# Patient Record
Sex: Male | Born: 1965 | Race: White | Hispanic: No | Marital: Single | Smoking: Current every day smoker
Health system: Southern US, Community
[De-identification: ages and names within clinical notes are randomized; demographics above are authoritative.]

## PROBLEM LIST (undated history)

## (undated) DIAGNOSIS — J45909 Unspecified asthma, uncomplicated: Secondary | ICD-10-CM

## (undated) DIAGNOSIS — J449 Chronic obstructive pulmonary disease, unspecified: Secondary | ICD-10-CM

## (undated) DIAGNOSIS — Z720 Tobacco use: Secondary | ICD-10-CM

## (undated) HISTORY — DX: Tobacco use: Z72.0

## (undated) HISTORY — PX: OTHER SURGICAL HISTORY: SHX169

---

## 2010-03-07 LAB — PULMONARY FUNCTION TEST

## 2018-10-28 ENCOUNTER — Encounter: Payer: Self-pay | Admitting: Emergency Medicine

## 2018-10-28 ENCOUNTER — Other Ambulatory Visit: Payer: Self-pay

## 2018-10-28 ENCOUNTER — Ambulatory Visit
Admission: EM | Admit: 2018-10-28 | Discharge: 2018-10-28 | Disposition: A | Discharge status: Home / Self Care | Payer: Worker's Compensation

## 2018-10-28 ENCOUNTER — Ambulatory Visit (INDEPENDENT_AMBULATORY_CARE_PROVIDER_SITE_OTHER): Payer: Worker's Compensation

## 2018-10-28 DIAGNOSIS — M25532 Pain in left wrist: Secondary | ICD-10-CM

## 2018-10-28 DIAGNOSIS — S6992XA Unspecified injury of left wrist, hand and finger(s), initial encounter: Secondary | ICD-10-CM | POA: Diagnosis not present

## 2018-10-28 HISTORY — DX: Chronic obstructive pulmonary disease, unspecified: J44.9

## 2018-10-28 MED ORDER — HYDROCODONE-ACETAMINOPHEN 5-325 MG PO TABS: 1.0000 | ORAL_TABLET | Freq: Two times a day (BID) | ORAL | 0 refills | Status: AC | PRN

## 2018-10-28 NOTE — ED Triage Notes (Signed)
Patient presents to [facility] for assessment of left hand and wrist pain after he got his hand caught in a turning wheel at work.

## 2018-10-28 NOTE — Discharge Instructions (Addendum)
Ice, rest, elevate the area(s) of pain.   Return if you develop worsening pain, swelling, decreasing to motion.

## 2018-10-28 NOTE — ED Notes (Signed)
Patient able to ambulate independently  

## 2018-10-28 NOTE — ED Provider Notes (Signed)
[name]    CSN: [id number] Arrival date & time: 10/28/18  1814      History   Chief Complaint Chief Complaint  Patient presents with  . Arm Pain    HPI Geoffrey Blankenship is a 53 y.o. male with history of COPD presenting for left hand and wrist pains after getting his hand caught in a turning wheel at work today.  Patient dorsum pain, swelling.  Tried taking 800 mg ibuprofen, applying ice without significant relief.  Of note, patient has previously fractured his right scaphoid x2.   Past Medical History:  Diagnosis Date  . COPD (chronic obstructive pulmonary disease) (HCC)     There are no active problems to display for this patient.   Past Surgical History:  Procedure Laterality Date  . ganglion cyst removal         Home Medications    Prior to Admission medications   Medication Sig Start Date End Date Taking? Authorizing Provider  albuterol (PROVENTIL) (2.5 MG/3ML) 0.083% nebulizer solution Take 2.5 mg by nebulization every 6 (six) hours as needed for wheezing or shortness of breath.   Yes [provider]  HYDROcodone-acetaminophen (NORCO/VICODIN) 5-325 MG tablet Take 1 tablet by mouth 2 (two) times daily as needed for up to 3 days. 10/28/18 10/31/18  Hall-Potvin, [place], PA-C    Family History History reviewed. No pertinent family history.  Social History Social History   Tobacco Use  . Smoking status: Current Every Day Smoker    Packs/day: 0.50  . Smokeless tobacco: Never Used  Substance Use Topics  . Alcohol use: Not Currently    Comment: very light  . Drug use: Never     Allergies   Morphine and related   Review of Systems Review of Systems  Constitutional: Negative for fatigue and fever.  Respiratory: Negative for cough and shortness of breath.   Cardiovascular: Negative for chest pain and palpitations.  Musculoskeletal:       Positive for left hand and wrist pain, decreased range of motion, weakness  Neurological:  Negative for weakness and numbness.     Physical Exam Triage Vital Signs ED Triage Vitals  Enc Vitals Group     BP 10/28/18 1830 123/86     Pulse Rate 10/28/18 1830 91     Resp 10/28/18 1830 20     Temp 10/28/18 1830 98.5 F (36.9 C)     Temp Source 10/28/18 1830 Oral     SpO2 10/28/18 1830 95 %     Weight --      Height --      Head Circumference --      Peak Flow --      Pain Score 10/28/18 1831 6     Pain Loc --      Pain Edu? --      Excl. in [place]? --    No data found.  Updated Vital Signs BP 123/86 (BP Location: Right Arm)   Pulse 91   Temp 98.5 F (36.9 C) (Oral)   Resp 20   SpO2 95%    Physical Exam Constitutional:      General: He is not in acute distress. HENT:     Head: Normocephalic and atraumatic.  Eyes:     General: No scleral icterus.    Pupils: Pupils are equal, round, and reactive to light.  Cardiovascular:     Rate and Rhythm: Normal rate.  Pulmonary:     Effort: Pulmonary effort is normal. No respiratory  distress.     Breath sounds: No wheezing.  Musculoskeletal:     Comments: No obvious bony deformity of left wrist/hand.  No distal radial or ulnar head tenderness.  TTP to snuffbox, carpals, MCP of second and third digit.  Decreased active ROM of left wrist, finger second to pain.  NVI  Skin:    General: Skin is warm.     Capillary Refill: Capillary refill takes less than 2 seconds.     Coloration: Skin is not jaundiced.     Findings: No bruising.  Neurological:     General: No focal deficit present.     Mental Status: He is alert.     Sensory: No sensory deficit.     Deep Tendon Reflexes: Reflexes normal.      UC Treatments / Results  Labs (all labs ordered are listed, but only abnormal results are displayed) Labs Reviewed - No data to display  EKG   Radiology Dg Wrist Complete Left  Addendum Date: 10/28/2018   ADDENDUM REPORT: 10/28/2018 19:47 ADDENDUM: By report, the patient is focally tender over the scaphoid. There is a  linear sclerotic area which most likely represents overlapping of bone in this area. Conceivably, an impaction type injury in this area could present in this manner. Given the clinical history and this finding, it is reasonable to consider immobilization with reimaging in approximately 7 days to further evaluate. If symptoms persist, MR would be the optimum study to assess for possible marrow edema in this area. These results were discussed by telephone at the time of interpretation on 10/28/2018 at 7:47 pm with provider BRITTANY HALL-POTVIN , who verbally acknowledged these results. Electronically Signed   By: Bretta BangWilliam  Woodruff III M.D.   On: 10/28/2018 19:47   Result Date: 10/28/2018 CLINICAL DATA:  Caught wrist and turning wheel EXAM: LEFT WRIST - COMPLETE 3+ VIEW COMPARISON:  None. FINDINGS: Frontal, oblique, lateral, and ulnar deviation scaphoid images were obtained. No fracture or dislocation. Joint spaces appear normal. No erosive change. IMPRESSION: No fracture or dislocation.  No evident arthropathy. Electronically Signed: By: Bretta BangWilliam  Woodruff III M.D. On: 10/28/2018 19:03    Procedures Procedures (including critical care time)  Medications Ordered in UC Medications - No data to display  Initial Impression / Assessment and Plan / UC Course  I have reviewed the triage vital signs and the nursing notes.  Pertinent labs & imaging results that were available during my care of the patient were reviewed by me and considered in my medical decision making (see chart for details).    1.  Acute left wrist pain Left wrist x-ray obtained in office, reviewed by me and radiology: Negative for fracture, though given focal tenderness over scaphoid and mechanism of injury, reimaging in 7 days after immobilization could be considered.  Reviewed findings with patient who verbalized understanding.  Provided short-term pain management to help him sleep through the night as well as thumb spica splint which he  tolerated well.  Patient had adverse reaction to morphine, though states he has tolerated hydrocodone well.  Hand orthopedist information given.  Return precautions discussed, patient verbalized understanding and is agreeable to plan. Final Clinical Impressions(s) / UC Diagnoses   Final diagnoses:  Acute pain of left wrist     Discharge Instructions     Ice, rest, elevate the area(s) of pain.   Return if you develop worsening pain, swelling, decreasing to motion.    ED Prescriptions    Medication Sig Dispense Auth. Provider  HYDROcodone-acetaminophen (NORCO/VICODIN) 5-325 MG tablet Take 1 tablet by mouth 2 (two) times daily as needed for up to 3 days. 6 tablet Hall-Potvin, [place], PA-C     I have reviewed the PDMP during this encounter.   Hall-Potvin, [place], [place] 10/28/18 2024

## 2019-07-11 ENCOUNTER — Ambulatory Visit: Payer: Managed Care, Other (non HMO) | Admitting: Internal Medicine

## 2019-07-11 ENCOUNTER — Encounter: Payer: Self-pay | Admitting: Internal Medicine

## 2019-07-11 ENCOUNTER — Other Ambulatory Visit: Payer: Self-pay

## 2019-07-11 VITALS — BP 110/70 | HR 87 | Temp 97.9°F

## 2019-07-11 DIAGNOSIS — J439 Emphysema, unspecified: Secondary | ICD-10-CM

## 2019-07-11 DIAGNOSIS — R208 Other disturbances of skin sensation: Secondary | ICD-10-CM

## 2019-07-11 DIAGNOSIS — Z720 Tobacco use: Secondary | ICD-10-CM | POA: Diagnosis not present

## 2019-07-11 DIAGNOSIS — H9193 Unspecified hearing loss, bilateral: Secondary | ICD-10-CM

## 2019-07-11 DIAGNOSIS — R200 Anesthesia of skin: Secondary | ICD-10-CM

## 2019-07-11 MED ORDER — BUDESONIDE-FORMOTEROL FUMARATE 160-4.5 MCG/ACT IN AERO: 2.0000 | INHALATION_SPRAY | Freq: Two times a day (BID) | RESPIRATORY_TRACT | 3 refills | Status: AC

## 2019-07-11 MED ORDER — SPIRIVA RESPIMAT 2.5 MCG/ACT IN AERS: 2.0000 | INHALATION_SPRAY | Freq: Every day | RESPIRATORY_TRACT | 2 refills | Status: AC

## 2019-07-11 MED ORDER — ALBUTEROL SULFATE HFA 108 (90 BASE) MCG/ACT IN AERS: 2.0000 | INHALATION_SPRAY | Freq: Four times a day (QID) | RESPIRATORY_TRACT | 2 refills | Status: AC | PRN

## 2019-07-11 MED ORDER — BUPROPION HCL ER (XL) 150 MG PO TB24: 150.0000 mg | ORAL_TABLET | Freq: Every day | ORAL | 1 refills | Status: DC

## 2019-07-11 NOTE — Patient Instructions (Addendum)
-  Nice seeing you today!!  -Start Symbicort 2 puffs twice daily.  -Start Spiriva 2 puffs daily.  -Albuterol as needed for shortness of breath.  -Referrals to audiology and sports medicine placed today.  -Start wellbutrin 150 mg daily as a smoking cessation aid.  -Please consider arranging counseling sessions to assist with smoking cessation.  -Schedule follow up in 3 months for your physical. Come in fasting that day. May schedule sooner if you would like to touch base about your smoking.

## 2019-07-11 NOTE — Progress Notes (Signed)
New Patient Office Visit     This visit occurred during the SARS-CoV-2 public health emergency.  Safety protocols were in place, including screening questions prior to the visit, additional usage of staff PPE, and extensive cleaning of exam room while observing appropriate contact time as indicated for disinfecting solutions.    CC/Reason for Visit: Establish care, discuss chronic conditions and some acute concerns Previous PCP: In [place] Last Visit: 1 to 2 years ago  HPI: Geoffrey Blankenship is a 54 y.o. male who is coming in today for the above mentioned reasons. Past Medical History is significant for: COPD and tobacco abuse.  He moved from [place] last year, he was in the [employer] and served 4 years.  He is separated, he has been a smoker for over 40 years of anywhere from 1 to 3 packs a day, he has had multiple surgeries including an appendectomy in 1987, cholecystectomy in 2011, left elbow repair in 2006 and a right ankle fracture in 2008.  His family history is significant for mother with alcohol abuse, asthma, COPD, depression, diabetes, hyperlipidemia and hypertension, father with alcohol abuse, asthma, COPD, depression, drug abuse, high cholesterol, hypertension.  His siblings have also suffered from drug addiction.  He would like to discuss some acute issues:  1.  He has been on albuterol and Flovent for his COPD and is requesting refills.  2.  He has significantly decreased hearing and is requesting an audiology referral.  3.  He is interested in smoking cessation.  4.  He had a ganglion cyst removed on the sole of his left foot in 2018 and ever since then has been experiencing some numbness of the plantar area, he had some custom orthotics at some point that worked well for him but once those "wore down" numbness returned.   Past Medical/Surgical History: Past Medical History:  Diagnosis Date  . COPD (chronic obstructive pulmonary disease) (Max)   . Tobacco abuse       Past Surgical History:  Procedure Laterality Date  . ganglion cyst removal      Social History:  reports that he has been smoking. He has been smoking about 0.50 packs per day. He has never used smokeless tobacco. He reports previous alcohol use. He reports that he does not use drugs.  Allergies: Allergies  Allergen Reactions  . Morphine And Related Other (See Comments)    "stops my heart"    Family History:  Family History  Problem Relation Age of Onset  . Hypertension Mother   . Hyperlipidemia Mother   . COPD Mother   . Alcohol abuse Mother   . Diabetes Mother   . Alcohol abuse Father   . COPD Father   . Diabetes Father   . Hypertension Father   . Hyperlipidemia Father      Current Outpatient Medications:  .  albuterol (VENTOLIN HFA) 108 (90 Base) MCG/ACT inhaler, Inhale 2 puffs into the lungs every 6 (six) hours as needed for wheezing or shortness of breath., Disp: 6.7 g, Rfl: 2 .  budesonide-formoterol (SYMBICORT) 160-4.5 MCG/ACT inhaler, Inhale 2 puffs into the lungs 2 (two) times daily., Disp: 1 Inhaler, Rfl: 3 .  buPROPion (WELLBUTRIN XL) 150 MG 24 hr tablet, Take 1 tablet (150 mg total) by mouth daily., Disp: 90 tablet, Rfl: 1 .  Tiotropium Bromide Monohydrate (SPIRIVA RESPIMAT) 2.5 MCG/ACT AERS, Inhale 2 puffs into the lungs daily., Disp: 4 g, Rfl: 2  Review of Systems:  Constitutional: Denies fever, chills,  diaphoresis, appetite change and fatigue.  HEENT: Denies photophobia, eye pain, redness, hearing loss, ear pain, congestion, sore throat, rhinorrhea, sneezing, mouth sores, trouble swallowing, neck pain, neck stiffness and tinnitus.   Respiratory: Denies SOB, DOE, cough, chest tightness,  and wheezing.   Cardiovascular: Denies chest pain, palpitations and leg swelling.  Gastrointestinal: Denies nausea, vomiting, abdominal pain, diarrhea, constipation, blood in stool and abdominal distention.  Genitourinary: Denies dysuria, urgency, frequency, hematuria,  flank pain and difficulty urinating.  Endocrine: Denies: hot or cold intolerance, sweats, changes in hair or nails, polyuria, polydipsia. Musculoskeletal: Denies myalgias, back pain, joint swelling, arthralgias and gait problem.  Skin: Denies pallor, rash and wound.  Neurological: Denies dizziness, seizures, syncope, weakness, light-headedness, numbness and headaches.  Hematological: Denies adenopathy. Easy bruising, personal or family bleeding history  Psychiatric/Behavioral: Denies suicidal ideation, mood changes, confusion, nervousness, sleep disturbance and agitation    Physical Exam: Vitals:   07/11/19 1445  BP: 110/70  Pulse: 87  Temp: 97.9 F (36.6 C)  TempSrc: Temporal  SpO2: 97%   There is no height or weight on file to calculate BMI.  Constitutional: NAD, calm, comfortable Eyes: PERRL, lids and conjunctivae normal ENMT: Mucous membranes are moist. Tympanic membrane is pearly white, no erythema or bulging. Neck: normal, supple, no masses, no thyromegaly Respiratory: clear to auscultation bilaterally, no wheezing, no crackles. Normal respiratory effort. No accessory muscle use.  Cardiovascular: Regular rate and rhythm, no murmurs / rubs / gallops. No extremity edema. 2+ pedal pulses.  Neurologic: Grossly intact and nonfocal Psychiatric: Normal judgment and insight. Alert and oriented x 3. Normal mood.    Impression and Plan:  Pulmonary emphysema, unspecified emphysema type (HCC)  -Optimize his regimen with Spiriva, Symbicort and albuterol as needed. -Consider pulmonary referral.  Tobacco abuse  -I have discussed tobacco cessation with the patient.  I have counseled the patient regarding the negative impacts of continued tobacco use including but not limited to lung cancer, COPD, and cardiovascular disease.  I have discussed alternatives to tobacco and modalities that may help facilitate tobacco cessation including but not limited to biofeedback, hypnosis, and  medications.  Total time spent with tobacco counseling was 9 minutes. -He is 100% willing to quit smoking. -Start Wellbutrin, have encouraged him to sign up for CBT sessions.    Bilateral hearing loss, unspecified hearing loss type  - Plan: Ambulatory referral to Audiology  Numbness of left foot  - Plan: Ambulatory referral to Sports Medicine    Patient Instructions  -Nice seeing you today!!  -Start Symbicort 2 puffs twice daily.  -Start Spiriva 2 puffs daily.  -Albuterol as needed for shortness of breath.  -Referrals to audiology and sports medicine placed today.  -Start wellbutrin 150 mg daily as a smoking cessation aid.  -Please consider arranging counseling sessions to assist with smoking cessation.  -Schedule follow up in 3 months for your physical. Come in fasting that day. May schedule sooner if you would like to touch base about your smoking.     Chaya Jan, MD [facility]

## 2019-07-16 ENCOUNTER — Encounter: Payer: Self-pay | Admitting: Internal Medicine

## 2019-07-17 NOTE — Telephone Encounter (Signed)
Prior auths were submitted for the following meds: -Spiriva respimat 2.73mcg/ct-key [id number] -Budesonide-formoterol fumarate 160-4.3mcg/ct Kristine Royal  Message sent to the pt with this information and pending further info from the insurance.

## 2019-07-21 ENCOUNTER — Encounter: Payer: Self-pay | Admitting: Family Medicine

## 2019-07-21 ENCOUNTER — Ambulatory Visit (INDEPENDENT_AMBULATORY_CARE_PROVIDER_SITE_OTHER): Payer: Managed Care, Other (non HMO) | Admitting: Family Medicine

## 2019-07-21 ENCOUNTER — Other Ambulatory Visit: Payer: Self-pay

## 2019-07-21 ENCOUNTER — Ambulatory Visit (INDEPENDENT_AMBULATORY_CARE_PROVIDER_SITE_OTHER): Payer: Managed Care, Other (non HMO)

## 2019-07-21 VITALS — BP 110/78 | HR 79 | Ht 71.5 in | Wt 182.0 lb

## 2019-07-21 DIAGNOSIS — M79672 Pain in left foot: Secondary | ICD-10-CM

## 2019-07-21 NOTE — Patient Instructions (Signed)
Thank you for coming in today. I think your issue in the forefoot is metatarsalgia.  YOu also need good arch support.  Ok to use the hapad metatarsal pads and scaphoid pads.  OK to also buy metatarsalgia insoles with good arch support.  If not enough let me know and I will refer to podiatry for orthotics.   You could also find similar insoles are sporting good store or a running store like [employer].

## 2019-07-21 NOTE — Progress Notes (Signed)
Subjective:    I'm seeing this patient as a consultation for:  Philip Aspen, Limmie Patricia, MD . Note will be routed back to referring provider/PCP.  CC: Left foot pain   I, Debbe Odea, am serving as a scribe for Dr. Clementeen Graham.  HPI: patient is a 54 year old male presenting to [facility] for L foot pain. Patient states that he can feel the pressure on his left lateral foot and is tingling at times. If he gets over 4000 steps a day his foot will hurt him really bad. Describes pain as a throbbing pain. Locates pain to lateral left foot and arch of foot. Did get new shoes yesterday.   Surgery on left ball of foot in [place] had a ganglion cyst removed. March 2019 was told the cyst was growing over ten years. Patient had special orthotic inserts mad had stopped using them 3 months ago. Dr. Julian Reil out of [place]. Out of [facility].  Past medical history, Surgical history, Family history, Social history, Allergies, and medications have been entered into the medical record, reviewed.   Review of Systems: No new headache, visual changes, nausea, vomiting, diarrhea, constipation, dizziness, abdominal pain, skin rash, fevers, chills, night sweats, weight loss, swollen lymph nodes, body aches, joint swelling, muscle aches, chest pain, shortness of breath, mood changes, visual or auditory hallucinations.   Objective:    Vitals:   07/21/19 1545  BP: 110/78  Pulse: 79  SpO2: 98%   General: Well Developed, well nourished, and in no acute distress.  Neuro/Psych: Alert and oriented x3, extra-ocular muscles intact, able to move all 4 extremities, sensation grossly intact. Skin: Warm and dry, no rashes noted.  Respiratory: Not using accessory muscles, speaking in full sentences, trachea midline.  Cardiovascular: Pulses palpable, no extremity edema. Abdomen: Does not appear distended. MSK: Left foot Mature scar medial hindfoot. Small callus  formation metatarsal heads 1-3 4 and 5. Normal foot and ankle motion. Tender palpation plantar fourth metatarsal head otherwise nontender. Strength intact. Pulses capillary fill and sensation are intact distally.  Lab and Radiology Results Diagnostic Limited MSK Ultrasound of: Left plantar foot Normal-appearing plantar fascia. Normal-appearing bony structures. Normal-appearing fourth metatarsal head without effusion. Impression: Largely normal-appearing plantar foot   Impression and Recommendations:    Assessment and Plan: 54 y.o. male with left plantar foot pain.  Majority of pain at forefoot is fourth metatarsalgia.  Also has some mid arch pain over structures normal-appearing in this area.  This is the area he had surgery previously. Plan to proceed with metatarsal pads and scaphoid pads.  Also recommend over-the-counter insole that we will do the same.  If not improving will refer to podiatry for custom orthotics.Marland Kitchen  PDMP not reviewed this encounter. Orders Placed This Encounter  Procedures  . [place] LIMITED JOINT SPACE STRUCTURES LOW LEFT    Standing Status:   Future    Number of Occurrences:   1    Standing Expiration Date:   07/20/2020    Order Specific Question:   Reason for Exam (SYMPTOM  OR DIAGNOSIS REQUIRED)    Answer:   Left foot pain    Order Specific Question:   Preferred imaging location?    Answer:   [facility]   No orders of the defined types were placed in this encounter.   Discussed warning signs or symptoms. Please see discharge instructions. Patient expresses understanding.   The above documentation has been reviewed and  is accurate and complete Lynne Leader, M.D.

## 2019-07-29 NOTE — Telephone Encounter (Signed)
Questions were answered and waiting on a determination.

## 2019-07-31 ENCOUNTER — Other Ambulatory Visit: Payer: Self-pay

## 2019-07-31 ENCOUNTER — Ambulatory Visit: Payer: Managed Care, Other (non HMO) | Attending: Internal Medicine | Admitting: Audiologist

## 2019-07-31 DIAGNOSIS — H903 Sensorineural hearing loss, bilateral: Secondary | ICD-10-CM | POA: Diagnosis present

## 2019-07-31 DIAGNOSIS — H9313 Tinnitus, bilateral: Secondary | ICD-10-CM | POA: Diagnosis present

## 2019-07-31 NOTE — Procedures (Signed)
  Outpatient Audiology and [facility] [address] [place], [place]  [address] [phone]  AUDIOLOGICAL  EVALUATION  NAME: Geoffrey Blankenship     DOB:   1965/06/16      MRN: [id number]                                                                                     DATE: 07/31/2019     REFERENT: Philip Aspen, Limmie Patricia, MD STATUS: Outpatient DIAGNOSIS: Sensorineural Hearing Loss Bilateral, Tinnitus Bilaterally     History: Geoffrey Blankenship is receiving a hearing evaluation due to concerns for tinnitus and hearing loss. Geoffrey Blankenship has difficulty hearing in background noise, crowds, and when people are at a distance. This difficulty began gradually. No pain or pressure reported in either ear.  Tinnitus present in both ears. Geoffrey Blankenship has a history of noise exposure from listening to headphones at very high volumes and using firearms without hearing protection throughout his life.  No other relevant case history reported.   Evaluation:   Otoscopy showed a clear view of the tympanic membranes, bilaterally  Tympanometry results were consistent with normal function of the middle ear, bilaterally   Audiometric testing was completed using conventional audiometry with headphone transducer. Speech Recognition Thresholds were consistent with pure tone averages. Word Recognition was excellent at  an elevated level and fair at conversation level. Pure tone thresholds show normal sloping to moderately severe sensorineural hearing loss in both ears. Test results are consistent with reports hearing difficulty.   Tinnitus pitch and loudness matched to 8k Hz at 68dB (about 3dB SL) at. This is a softer than average tinnitus showing good candidacy for tinnitus retraining therapy.  Results:  The test results were reviewed with Geoffrey Blankenship. He was given two copies of the results. The hearing loss severity was explained. Geoffrey Blankenship was counseled on the pitch of his tinnitus and what is an appropriate next step. Due to the  significant improvement in speech recognition at an elevated volume, and low sensation level of his tinnitus Geoffrey Blankenship is an excellent candidate for hearing aids with a tinnitus masking or notch therapy protocol. He was given a list of local audiologists who dispense hearing aids to pursue amplification    Recommendations: 1.   Amplification is necessary for both ears. Hearing aids can be purchased from a variety of locations. See provided list for locations in the [place] area. An audiologist familiar with programming notch and or masking therapy for tinnitus is necessary due to Geoffrey Blankenship's significant tinnitus in both ears.   Geoffrey Blankenship  Audiologist, Au.D., CCC-A  07/31/2019  3:54 PM  Cc: Philip Aspen, Limmie Patricia, MD

## 2019-08-01 ENCOUNTER — Encounter: Payer: Self-pay | Admitting: Internal Medicine

## 2019-08-06 NOTE — Telephone Encounter (Signed)
Prior Auth denied - patient should try Ellipta first

## 2019-08-15 ENCOUNTER — Telehealth: Payer: Self-pay | Admitting: *Deleted

## 2019-08-15 NOTE — Telephone Encounter (Signed)
Spoke with patient and Spiriva is covered with a savings card.  New prior auth was started for Symbicort   (Key: [id number]).  This does not require a Proir aut but is expensive.  Patient will call his insurance company and call us back.    FYI - patient has his last cigarette 08/09/19.

## 2019-10-24 ENCOUNTER — Ambulatory Visit (INDEPENDENT_AMBULATORY_CARE_PROVIDER_SITE_OTHER): Payer: Managed Care, Other (non HMO) | Admitting: Internal Medicine

## 2019-10-24 ENCOUNTER — Other Ambulatory Visit: Payer: Self-pay

## 2019-10-24 ENCOUNTER — Encounter: Payer: Self-pay | Admitting: Internal Medicine

## 2019-10-24 VITALS — BP 102/68 | HR 82 | Temp 98.2°F | Ht 71.0 in | Wt 188.8 lb

## 2019-10-24 DIAGNOSIS — Z23 Encounter for immunization: Secondary | ICD-10-CM

## 2019-10-24 DIAGNOSIS — J439 Emphysema, unspecified: Secondary | ICD-10-CM | POA: Diagnosis not present

## 2019-10-24 DIAGNOSIS — Z720 Tobacco use: Secondary | ICD-10-CM | POA: Diagnosis not present

## 2019-10-24 DIAGNOSIS — Z1211 Encounter for screening for malignant neoplasm of colon: Secondary | ICD-10-CM

## 2019-10-24 DIAGNOSIS — Z0000 Encounter for general adult medical examination without abnormal findings: Secondary | ICD-10-CM | POA: Diagnosis not present

## 2019-10-24 NOTE — Patient Instructions (Signed)
-Nice seeing you today!!  -Lab work today; will notify you once results are available.  -Tetanus and shingles vaccines today.  -Great job on quitting smoking!  -Schedule follow up in 6 months.   Preventive Care 51-55 Years Old, Male Preventive care refers to lifestyle choices and visits with your health care provider that can promote health and wellness. This includes:  A yearly physical exam. This is also called an annual well check.  Regular dental and eye exams.  Immunizations.  Screening for certain conditions.  Healthy lifestyle choices, such as eating a healthy diet, getting regular exercise, not using drugs or products that contain nicotine and tobacco, and limiting alcohol use. What can I expect for my preventive care visit? Physical exam Your health care provider will check:  Height and weight. These may be used to calculate body mass index (BMI), which is a measurement that tells if you are at a healthy weight.  Heart rate and blood pressure.  Your skin for abnormal spots. Counseling Your health care provider may ask you questions about:  Alcohol, tobacco, and drug use.  Emotional well-being.  Home and relationship well-being.  Sexual activity.  Eating habits.  Work and work Astronomer. What immunizations do I need?  Influenza (flu) vaccine  This is recommended every year. Tetanus, diphtheria, and pertussis (Tdap) vaccine  You may need a Td booster every 10 years. Varicella (chickenpox) vaccine  You may need this vaccine if you have not already been vaccinated. Zoster (shingles) vaccine  You may need this after age 45. Measles, mumps, and rubella (MMR) vaccine  You may need at least one dose of MMR if you were born in 1957 or later. You may also need a second dose. Pneumococcal conjugate (PCV13) vaccine  You may need this if you have certain conditions and were not previously vaccinated. Pneumococcal polysaccharide (PPSV23) vaccine  You  may need one or two doses if you smoke cigarettes or if you have certain conditions. Meningococcal conjugate (MenACWY) vaccine  You may need this if you have certain conditions. Hepatitis A vaccine  You may need this if you have certain conditions or if you travel or work in places where you may be exposed to hepatitis A. Hepatitis B vaccine  You may need this if you have certain conditions or if you travel or work in places where you may be exposed to hepatitis B. Haemophilus influenzae type b (Hib) vaccine  You may need this if you have certain risk factors. Human papillomavirus (HPV) vaccine  If recommended by your health care provider, you may need three doses over 6 months. You may receive vaccines as individual doses or as more than one vaccine together in one shot (combination vaccines). Talk with your health care provider about the risks and benefits of combination vaccines. What tests do I need? Blood tests  Lipid and cholesterol levels. These may be checked every 5 years, or more frequently if you are over 77 years old.  Hepatitis C test.  Hepatitis B test. Screening  Lung cancer screening. You may have this screening every year starting at age 39 if you have a 30-pack-year history of smoking and currently smoke or have quit within the past 15 years.  Prostate cancer screening. Recommendations will vary depending on your family history and other risks.  Colorectal cancer screening. All adults should have this screening starting at age 71 and continuing until age 58. Your health care provider may recommend screening at age 97 if you are at  increased risk. You will have tests every 1-10 years, depending on your results and the type of screening test.  Diabetes screening. This is done by checking your blood sugar (glucose) after you have not eaten for a while (fasting). You may have this done every 1-3 years.  Sexually transmitted disease (STD) testing. Follow these  instructions at home: Eating and drinking  Eat a diet that includes fresh fruits and vegetables, whole grains, lean protein, and low-fat dairy products.  Take vitamin and mineral supplements as recommended by your health care provider.  Do not drink alcohol if your health care provider tells you not to drink.  If you drink alcohol: ? Limit how much you have to 0-2 drinks a day. ? Be aware of how much alcohol is in your drink. In the [place] one drink equals one 12 oz bottle of beer (355 mL), one 5 oz glass of wine (148 mL), or one 1 oz glass of hard liquor (44 mL). Lifestyle  Take daily care of your teeth and gums.  Stay active. Exercise for at least 30 minutes on 5 or more days each week.  Do not use any products that contain nicotine or tobacco, such as cigarettes, e-cigarettes, and chewing tobacco. If you need help quitting, ask your health care provider.  If you are sexually active, practice safe sex. Use a condom or other form of protection to prevent STIs (sexually transmitted infections).  Talk with your health care provider about taking a low-dose aspirin every day starting at age 53. What's next?  Go to your health care provider once a year for a well check visit.  Ask your health care provider how often you should have your eyes and teeth checked.  Stay up to date on all vaccines. This information is not intended to replace advice given to you by your health care provider. Make sure you discuss any questions you have with your health care provider. Document Revised: 01/17/2018 Document Reviewed: 01/17/2018 Elsevier Patient Education  2020 [employer].

## 2019-10-24 NOTE — Progress Notes (Signed)
   Established Patient Office Visit     This visit occurred during the SARS-CoV-2 public health emergency.  Safety protocols were in place, including screening questions prior to the visit, additional usage of staff PPE, and extensive cleaning of exam room while observing appropriate contact time as indicated for disinfecting solutions.    CC/Reason for Visit: Annual preventive exam  HPI: Geoffrey Blankenship is a 54 y.o. male who is coming in today for the above mentioned reasons. Past Medical History is significant for: COPD and tobacco abuse. At last visit he was started on Wellbutrin to assist with smoking cessation and he is proud to report that he has fully quit smoking. He has been having a counselor through his insurance who checks in with him once a week. He is due for Covid, flu, tetanus, shingles vaccines. He is overdue for screening colonoscopy. He has no acute complaints today.   Past Medical/Surgical History: Past Medical History:  Diagnosis Date  . COPD (chronic obstructive pulmonary disease) (HCC)   . Tobacco abuse     Past Surgical History:  Procedure Laterality Date  . ganglion cyst removal      Social History:  reports that he has been smoking. He has been smoking about 0.50 packs per day. He has never used smokeless tobacco. He reports previous alcohol use. He reports that he does not use drugs.  Allergies: Allergies  Allergen Reactions  . Morphine And Related Other (See Comments)    "stops my heart"    Family History:  Family History  Problem Relation Age of Onset  . Hypertension Mother   . Hyperlipidemia Mother   . COPD Mother   . Alcohol abuse Mother   . Diabetes Mother   . Alcohol abuse Father   . COPD Father   . Diabetes Father   . Hypertension Father   . Hyperlipidemia Father      Current Outpatient Medications:  .  albuterol (VENTOLIN HFA) 108 (90 Base) MCG/ACT inhaler, Inhale 2 puffs into the lungs every 6 (six) hours as needed for  wheezing or shortness of breath., Disp: 6.7 g, Rfl: 2 .  budesonide-formoterol (SYMBICORT) 160-4.5 MCG/ACT inhaler, Inhale 2 puffs into the lungs 2 (two) times daily., Disp: 1 Inhaler, Rfl: 3 .  buPROPion (WELLBUTRIN XL) 150 MG 24 hr tablet, Take 1 tablet (150 mg total) by mouth daily., Disp: 90 tablet, Rfl: 1 .  Tiotropium Bromide Monohydrate (SPIRIVA RESPIMAT) 2.5 MCG/ACT AERS, Inhale 2 puffs into the lungs daily., Disp: 4 g, Rfl: 2  Review of Systems:  Constitutional: Denies fever, chills, diaphoresis, appetite change and fatigue.  HEENT: Denies photophobia, eye pain, redness, hearing loss, ear pain, congestion, sore throat, rhinorrhea, sneezing, mouth sores, trouble swallowing, neck pain, neck stiffness and tinnitus.   Respiratory: Denies SOB, DOE, cough, chest tightness,  and wheezing.   Cardiovascular: Denies chest pain, palpitations and leg swelling.  Gastrointestinal: Denies nausea, vomiting, abdominal pain, diarrhea, constipation, blood in stool and abdominal distention.  Genitourinary: Denies dysuria, urgency, frequency, hematuria, flank pain and difficulty urinating.  Endocrine: Denies: hot or cold intolerance, sweats, changes in hair or nails, polyuria, polydipsia. Musculoskeletal: Denies myalgias, back pain, joint swelling, arthralgias and gait problem.  Skin: Denies pallor, rash and wound.  Neurological: Denies dizziness, seizures, syncope, weakness, light-headedness, numbness and headaches.  Hematological: Denies adenopathy. Easy bruising, personal or family bleeding history  Psychiatric/Behavioral: Denies suicidal ideation, mood changes, confusion, nervousness, sleep disturbance and agitation    Physical Exam: Vitals:   10/24/19   0829  BP: 102/68  Pulse: 82  Temp: 98.2 F (36.8 C)  TempSrc: Oral  SpO2: 95%  Weight: 188 lb 12.8 oz (85.6 kg)  Height: 5' 11" (1.803 m)    Body mass index is 26.33 kg/m.   Constitutional: NAD, calm, comfortable Eyes: PERRL, lids and  conjunctivae normal, wears corrective lenses. ENMT: Mucous membranes are moist. Posterior pharynx clear of any exudate or lesions. Normal dentition. Tympanic membrane is pearly white, no erythema or bulging. Neck: normal, supple, no masses, no thyromegaly Respiratory: clear to auscultation bilaterally, no wheezing, no crackles. Normal respiratory effort. No accessory muscle use.  Cardiovascular: Regular rate and rhythm, no murmurs / rubs / gallops. No extremity edema. 2+ pedal pulses. No carotid bruits.  Abdomen: no tenderness, no masses palpated. No hepatosplenomegaly. Bowel sounds positive.  Musculoskeletal: no clubbing / cyanosis. No joint deformity upper and lower extremities. Good ROM, no contractures. Normal muscle tone.  Skin: no rashes, lesions, ulcers. No induration Neurologic: CN 2-12 grossly intact. Sensation intact, DTR normal. Strength 5/5 in all 4.  Psychiatric: Normal judgment and insight. Alert and oriented x 3. Normal mood.    Impression and Plan:  Encounter for preventive health examination  -He has routine eye care, have advised routine dental care. -He will receive Tdap and for shingles vaccine today, he declines flu and Covid vaccinations. -Screening labs today. -Healthy lifestyle discussed in detail. -Refer to GI for screening colonoscopy. -PSA for prostate cancer screening.  Screening for malignant neoplasm of colon  - Plan: Ambulatory referral to Gastroenterology  Pulmonary emphysema, unspecified emphysema type (HCC)  -He is on Symbicort and Spiriva. -Compensated.  Tobacco abuse -He has been congratulated on his success at quitting smoking, I will continue Wellbutrin for now.  Need for tetanus booster -Tdap administered today.  Need for shingles vaccine -First shingles vaccine administered today.    Patient Instructions  -Nice seeing you today!!  -Lab work today; will notify you once results are available.  -Tetanus and shingles vaccines  today.  -Great job on quitting smoking!  -Schedule follow up in 6 months.   Preventive Care 40-64 Years Old, Male Preventive care refers to lifestyle choices and visits with your health care provider that can promote health and wellness. This includes:  A yearly physical exam. This is also called an annual well check.  Regular dental and eye exams.  Immunizations.  Screening for certain conditions.  Healthy lifestyle choices, such as eating a healthy diet, getting regular exercise, not using drugs or products that contain nicotine and tobacco, and limiting alcohol use. What can I expect for my preventive care visit? Physical exam Your health care provider will check:  Height and weight. These may be used to calculate body mass index (BMI), which is a measurement that tells if you are at a healthy weight.  Heart rate and blood pressure.  Your skin for abnormal spots. Counseling Your health care provider may ask you questions about:  Alcohol, tobacco, and drug use.  Emotional well-being.  Home and relationship well-being.  Sexual activity.  Eating habits.  Work and work environment. What immunizations do I need?  Influenza (flu) vaccine  This is recommended every year. Tetanus, diphtheria, and pertussis (Tdap) vaccine  You may need a Td booster every 10 years. Varicella (chickenpox) vaccine  You may need this vaccine if you have not already been vaccinated. Zoster (shingles) vaccine  You may need this after age 60. Measles, mumps, and rubella (MMR) vaccine  You may need at least   one dose of MMR if you were born in 1957 or later. You may also need a second dose. Pneumococcal conjugate (PCV13) vaccine  You may need this if you have certain conditions and were not previously vaccinated. Pneumococcal polysaccharide (PPSV23) vaccine  You may need one or two doses if you smoke cigarettes or if you have certain conditions. Meningococcal conjugate (MenACWY)  vaccine  You may need this if you have certain conditions. Hepatitis A vaccine  You may need this if you have certain conditions or if you travel or work in places where you may be exposed to hepatitis A. Hepatitis B vaccine  You may need this if you have certain conditions or if you travel or work in places where you may be exposed to hepatitis B. Haemophilus influenzae type b (Hib) vaccine  You may need this if you have certain risk factors. Human papillomavirus (HPV) vaccine  If recommended by your health care provider, you may need three doses over 6 months. You may receive vaccines as individual doses or as more than one vaccine together in one shot (combination vaccines). Talk with your health care provider about the risks and benefits of combination vaccines. What tests do I need? Blood tests  Lipid and cholesterol levels. These may be checked every 5 years, or more frequently if you are over 50 years old.  Hepatitis C test.  Hepatitis B test. Screening  Lung cancer screening. You may have this screening every year starting at age 55 if you have a 30-pack-year history of smoking and currently smoke or have quit within the past 15 years.  Prostate cancer screening. Recommendations will vary depending on your family history and other risks.  Colorectal cancer screening. All adults should have this screening starting at age 50 and continuing until age 75. Your health care provider may recommend screening at age 45 if you are at increased risk. You will have tests every 1-10 years, depending on your results and the type of screening test.  Diabetes screening. This is done by checking your blood sugar (glucose) after you have not eaten for a while (fasting). You may have this done every 1-3 years.  Sexually transmitted disease (STD) testing. Follow these instructions at home: Eating and drinking  Eat a diet that includes fresh fruits and vegetables, whole grains, lean protein,  and low-fat dairy products.  Take vitamin and mineral supplements as recommended by your health care provider.  Do not drink alcohol if your health care provider tells you not to drink.  If you drink alcohol: ? Limit how much you have to 0-2 drinks a day. ? Be aware of how much alcohol is in your drink. In the [place] one drink equals one 12 oz bottle of beer (355 mL), one 5 oz glass of wine (148 mL), or one 1 oz glass of hard liquor (44 mL). Lifestyle  Take daily care of your teeth and gums.  Stay active. Exercise for at least 30 minutes on 5 or more days each week.  Do not use any products that contain nicotine or tobacco, such as cigarettes, e-cigarettes, and chewing tobacco. If you need help quitting, ask your health care provider.  If you are sexually active, practice safe sex. Use a condom or other form of protection to prevent STIs (sexually transmitted infections).  Talk with your health care provider about taking a low-dose aspirin every day starting at age 50. What's next?  Go to your health care provider once a year for a   well check visit.  Ask your health care provider how often you should have your eyes and teeth checked.  Stay up to date on all vaccines. This information is not intended to replace advice given to you by your health care provider. Make sure you discuss any questions you have with your health care provider. Document Revised: 01/17/2018 Document Reviewed: 01/17/2018 Elsevier Patient Education  2020 [place], [place] [facility]

## 2019-10-25 LAB — CBC WITH DIFFERENTIAL/PLATELET
Absolute Monocytes: 974 cells/uL — ABNORMAL HIGH (ref 200–950)
Basophils Absolute: 52 cells/uL (ref 0–200)
Basophils Relative: 0.3 %
Eosinophils Absolute: 226 cells/uL (ref 15–500)
Eosinophils Relative: 1.3 %
HCT: 51.2 % — ABNORMAL HIGH (ref 38.5–50.0)
Hemoglobin: 17.3 g/dL — ABNORMAL HIGH (ref 13.2–17.1)
Lymphs Abs: 2262 cells/uL (ref 850–3900)
MCH: 30.5 pg (ref 27.0–33.0)
MCHC: 33.8 g/dL (ref 32.0–36.0)
MCV: 90.3 fL (ref 80.0–100.0)
MPV: 11.8 fL (ref 7.5–12.5)
Monocytes Relative: 5.6 %
Neutro Abs: 13885 cells/uL — ABNORMAL HIGH (ref 1500–7800)
Neutrophils Relative %: 79.8 %
Platelets: 202 10*3/uL (ref 140–400)
RBC: 5.67 10*6/uL (ref 4.20–5.80)
RDW: 13.3 % (ref 11.0–15.0)
Total Lymphocyte: 13 %
WBC: 17.4 10*3/uL — ABNORMAL HIGH (ref 3.8–10.8)

## 2019-10-25 LAB — COMPREHENSIVE METABOLIC PANEL
AG Ratio: 1.5 (calc) (ref 1.0–2.5)
ALT: 17 U/L (ref 9–46)
AST: 13 U/L (ref 10–35)
Albumin: 4.3 g/dL (ref 3.6–5.1)
Alkaline phosphatase (APISO): 71 U/L (ref 35–144)
BUN: 12 mg/dL (ref 7–25)
CO2: 24 mmol/L (ref 20–32)
Calcium: 9.4 mg/dL (ref 8.6–10.3)
Chloride: 104 mmol/L (ref 98–110)
Creat: 1 mg/dL (ref 0.70–1.33)
Globulin: 2.8 g/dL (calc) (ref 1.9–3.7)
Glucose, Bld: 94 mg/dL (ref 65–99)
Potassium: 4.6 mmol/L (ref 3.5–5.3)
Sodium: 139 mmol/L (ref 135–146)
Total Bilirubin: 0.9 mg/dL (ref 0.2–1.2)
Total Protein: 7.1 g/dL (ref 6.1–8.1)

## 2019-10-25 LAB — LIPID PANEL
Cholesterol: 157 mg/dL (ref <200)
HDL: 37 mg/dL — ABNORMAL LOW (ref >=40)
LDL Cholesterol (Calc): 99 mg/dL (calc)
Non-HDL Cholesterol (Calc): 120 mg/dL (calc) (ref <130)
Total CHOL/HDL Ratio: 4.2 (calc) (ref <5.0)
Triglycerides: 117 mg/dL (ref <150)

## 2019-10-25 LAB — PSA: PSA: 0.88 ng/mL (ref <=4.0)

## 2019-10-28 ENCOUNTER — Other Ambulatory Visit: Payer: Self-pay | Admitting: Internal Medicine

## 2019-10-28 DIAGNOSIS — R7989 Other specified abnormal findings of blood chemistry: Secondary | ICD-10-CM

## 2019-11-18 ENCOUNTER — Other Ambulatory Visit: Payer: Managed Care, Other (non HMO)

## 2019-11-18 ENCOUNTER — Other Ambulatory Visit: Payer: Self-pay

## 2019-11-18 DIAGNOSIS — R7989 Other specified abnormal findings of blood chemistry: Secondary | ICD-10-CM

## 2019-11-18 LAB — CBC WITH DIFFERENTIAL/PLATELET
Absolute Monocytes: 894 cells/uL (ref 200–950)
Basophils Absolute: 69 cells/uL (ref 0–200)
Basophils Relative: 0.4 %
Eosinophils Absolute: 224 cells/uL (ref 15–500)
Eosinophils Relative: 1.3 %
HCT: 50 % (ref 38.5–50.0)
Hemoglobin: 17.5 g/dL — ABNORMAL HIGH (ref 13.2–17.1)
Lymphs Abs: 1909 cells/uL (ref 850–3900)
MCH: 30.9 pg (ref 27.0–33.0)
MCHC: 35 g/dL (ref 32.0–36.0)
MCV: 88.3 fL (ref 80.0–100.0)
MPV: 11 fL (ref 7.5–12.5)
Monocytes Relative: 5.2 %
Neutro Abs: 14104 cells/uL — ABNORMAL HIGH (ref 1500–7800)
Neutrophils Relative %: 82 %
Platelets: 215 10*3/uL (ref 140–400)
RBC: 5.66 10*6/uL (ref 4.20–5.80)
RDW: 13.3 % (ref 11.0–15.0)
Total Lymphocyte: 11.1 %
WBC: 17.2 10*3/uL — ABNORMAL HIGH (ref 3.8–10.8)

## 2019-11-20 ENCOUNTER — Other Ambulatory Visit: Payer: Managed Care, Other (non HMO)

## 2019-11-20 ENCOUNTER — Encounter (INDEPENDENT_AMBULATORY_CARE_PROVIDER_SITE_OTHER): Payer: Self-pay

## 2019-11-20 DIAGNOSIS — Z20822 Contact with and (suspected) exposure to covid-19: Secondary | ICD-10-CM

## 2019-11-21 LAB — NOVEL CORONAVIRUS, NAA: SARS-CoV-2, NAA: DETECTED — AB

## 2019-11-21 LAB — SARS-COV-2, NAA 2 DAY TAT

## 2019-11-23 ENCOUNTER — Encounter: Payer: Self-pay | Admitting: Internal Medicine

## 2019-11-23 ENCOUNTER — Other Ambulatory Visit: Payer: Self-pay | Admitting: Physician Assistant

## 2019-11-23 DIAGNOSIS — J42 Unspecified chronic bronchitis: Secondary | ICD-10-CM

## 2019-11-23 DIAGNOSIS — E663 Overweight: Secondary | ICD-10-CM

## 2019-11-23 DIAGNOSIS — U071 COVID-19: Secondary | ICD-10-CM

## 2019-11-23 NOTE — Progress Notes (Signed)
I connected by phone with Geoffrey Blankenship on 11/23/2019 at 12:27 PM to discuss the potential use of a new treatment for mild to moderate COVID-19 viral infection in non-hospitalized patients.  This patient is a 54 y.o. male that meets the FDA criteria for Emergency Use Authorization of COVID monoclonal antibody casirivimab/imdevimab or bamlanivimab/eteseviamb.  Has a (+) direct SARS-CoV-2 viral test result  Has mild or moderate COVID-19   Is NOT hospitalized due to COVID-19  Is within 10 days of symptom onset  Has at least one of the high risk factor(s) for progression to severe COVID-19 and/or hospitalization as defined in EUA.  Specific high risk criteria : BMI > 25 and Chronic Lung Disease   I have spoken and communicated the following to the patient or parent/caregiver regarding COVID monoclonal antibody treatment:  1. FDA has authorized the emergency use for the treatment of mild to moderate COVID-19 in adults and pediatric patients with positive results of direct SARS-CoV-2 viral testing who are 2 years of age and older weighing at least 40 kg, and who are at high risk for progressing to severe COVID-19 and/or hospitalization.  2. The significant known and potential risks and benefits of COVID monoclonal antibody, and the extent to which such potential risks and benefits are unknown.  3. Information on available alternative treatments and the risks and benefits of those alternatives, including clinical trials.  4. Patients treated with COVID monoclonal antibody should continue to self-isolate and use infection control measures (e.g., wear mask, isolate, social distance, avoid sharing personal items, clean and disinfect "high touch" surfaces, and frequent handwashing) according to CDC guidelines.   5. The patient or parent/caregiver has the option to accept or refuse COVID monoclonal antibody treatment.  After reviewing this information with the patient, the patient has agreed  to receive one of the available covid 19 monoclonal antibodies and will be provided an appropriate fact sheet prior to infusion.   [place], [place] 11/23/2019 12:27 PM

## 2019-11-24 ENCOUNTER — Ambulatory Visit (HOSPITAL_COMMUNITY): Payer: Managed Care, Other (non HMO)

## 2019-11-24 ENCOUNTER — Ambulatory Visit (HOSPITAL_COMMUNITY)
Admission: RE | Admit: 2019-11-24 | Discharge: 2019-11-24 | Disposition: A | Discharge status: Home / Self Care | Payer: Managed Care, Other (non HMO) | Source: Ambulatory Visit | Attending: Pulmonary Disease | Admitting: Pulmonary Disease

## 2019-11-24 DIAGNOSIS — U071 COVID-19: Secondary | ICD-10-CM | POA: Diagnosis not present

## 2019-11-24 MED ORDER — ACETAMINOPHEN 325 MG PO TABS
650.0000 mg | ORAL_TABLET | Freq: Once | ORAL | Status: AC
  Administered 2019-11-24: 650 mg via ORAL
  Filled 2019-11-24: qty 2

## 2019-11-24 MED ORDER — METHYLPREDNISOLONE SODIUM SUCC 125 MG IJ SOLR: 125.0000 mg | Freq: Once | INTRAMUSCULAR | Status: DC | PRN

## 2019-11-24 MED ORDER — SODIUM CHLORIDE 0.9 % IV SOLN: Freq: Once | INTRAVENOUS | Status: AC

## 2019-11-24 MED ORDER — SODIUM CHLORIDE 0.9 % IV SOLN: INTRAVENOUS | Status: DC | PRN

## 2019-11-24 MED ORDER — FAMOTIDINE IN NACL 20-0.9 MG/50ML-% IV SOLN: 20.0000 mg | Freq: Once | INTRAVENOUS | Status: DC | PRN

## 2019-11-24 MED ORDER — DIPHENHYDRAMINE HCL 50 MG/ML IJ SOLN: 50.0000 mg | Freq: Once | INTRAMUSCULAR | Status: DC | PRN

## 2019-11-24 MED ORDER — EPINEPHRINE 0.3 MG/0.3ML IJ SOAJ: 0.3000 mg | Freq: Once | INTRAMUSCULAR | Status: DC | PRN

## 2019-11-24 MED ORDER — ALBUTEROL SULFATE HFA 108 (90 BASE) MCG/ACT IN AERS: 2.0000 | INHALATION_SPRAY | Freq: Once | RESPIRATORY_TRACT | Status: DC | PRN

## 2019-11-24 NOTE — Discharge Instructions (Signed)

## 2019-11-24 NOTE — Progress Notes (Signed)
  Diagnosis: COVID-19  Physician:Dr Wright  Procedure: [facility] Med: bamlanivimab\etesevimab infusion - Provided patient with bamlanimivab\etesevimab fact sheet for patients, parents and caregivers prior to infusion.  Complications: No immediate complications noted.  Discharge: Discharged home   Geoffrey Blankenship 11/24/2019  

## 2020-01-12 ENCOUNTER — Other Ambulatory Visit: Payer: Self-pay | Admitting: Internal Medicine

## 2020-01-12 DIAGNOSIS — Z720 Tobacco use: Secondary | ICD-10-CM

## 2020-05-12 ENCOUNTER — Encounter (HOSPITAL_BASED_OUTPATIENT_CLINIC_OR_DEPARTMENT_OTHER): Payer: Self-pay | Admitting: Emergency Medicine

## 2020-05-12 ENCOUNTER — Emergency Department (HOSPITAL_BASED_OUTPATIENT_CLINIC_OR_DEPARTMENT_OTHER)
Admission: EM | Admit: 2020-05-12 | Discharge: 2020-05-12 | Disposition: A | Discharge status: Home / Self Care | Payer: Managed Care, Other (non HMO) | Attending: Emergency Medicine | Admitting: Emergency Medicine

## 2020-05-12 ENCOUNTER — Other Ambulatory Visit: Payer: Self-pay

## 2020-05-12 ENCOUNTER — Emergency Department (HOSPITAL_BASED_OUTPATIENT_CLINIC_OR_DEPARTMENT_OTHER): Payer: Managed Care, Other (non HMO)

## 2020-05-12 DIAGNOSIS — J441 Chronic obstructive pulmonary disease with (acute) exacerbation: Secondary | ICD-10-CM

## 2020-05-12 DIAGNOSIS — F17200 Nicotine dependence, unspecified, uncomplicated: Secondary | ICD-10-CM | POA: Diagnosis not present

## 2020-05-12 DIAGNOSIS — J449 Chronic obstructive pulmonary disease, unspecified: Secondary | ICD-10-CM | POA: Diagnosis not present

## 2020-05-12 DIAGNOSIS — J45909 Unspecified asthma, uncomplicated: Secondary | ICD-10-CM | POA: Diagnosis not present

## 2020-05-12 DIAGNOSIS — Z7951 Long term (current) use of inhaled steroids: Secondary | ICD-10-CM | POA: Diagnosis not present

## 2020-05-12 DIAGNOSIS — R0602 Shortness of breath: Secondary | ICD-10-CM | POA: Diagnosis present

## 2020-05-12 HISTORY — DX: Unspecified asthma, uncomplicated: J45.909

## 2020-05-12 HISTORY — DX: Chronic obstructive pulmonary disease, unspecified: J44.9

## 2020-05-12 LAB — CBG MONITORING, ED: Glucose-Capillary: 121 mg/dL — ABNORMAL HIGH (ref 70–99)

## 2020-05-12 LAB — CBC
HCT: 47.3 % (ref 39.0–52.0)
Hemoglobin: 16 g/dL (ref 13.0–17.0)
MCH: 30.5 pg (ref 26.0–34.0)
MCHC: 33.8 g/dL (ref 30.0–36.0)
MCV: 90.1 fL (ref 80.0–100.0)
Platelets: 227 10*3/uL (ref 150–400)
RBC: 5.25 MIL/uL (ref 4.22–5.81)
RDW: 12.9 % (ref 11.5–15.5)
WBC: 12.6 10*3/uL — ABNORMAL HIGH (ref 4.0–10.5)
nRBC: 0 % (ref 0.0–0.2)

## 2020-05-12 LAB — BASIC METABOLIC PANEL
Anion gap: 10 (ref 5–15)
BUN: 9 mg/dL (ref 6–20)
CO2: 21 mmol/L — ABNORMAL LOW (ref 22–32)
Calcium: 8.6 mg/dL — ABNORMAL LOW (ref 8.9–10.3)
Chloride: 105 mmol/L (ref 98–111)
Creatinine, Ser: 0.91 mg/dL (ref 0.61–1.24)
GFR, Estimated: >60 mL/min (ref >60)
Glucose, Bld: 138 mg/dL — ABNORMAL HIGH (ref 70–99)
Potassium: 3.6 mmol/L (ref 3.5–5.1)
Sodium: 136 mmol/L (ref 135–145)

## 2020-05-12 LAB — D-DIMER, QUANTITATIVE: D-Dimer, Quant: <0.27 ug/mL-FEU (ref 0.00–0.50)

## 2020-05-12 LAB — TROPONIN I (HIGH SENSITIVITY)
Troponin I (High Sensitivity): <2 ng/L (ref <18)
Troponin I (High Sensitivity): <2 ng/L (ref <18)

## 2020-05-12 MED ORDER — METHYLPREDNISOLONE SODIUM SUCC 125 MG IJ SOLR
125.0000 mg | Freq: Once | INTRAMUSCULAR | Status: AC
  Administered 2020-05-12: 125 mg via INTRAVENOUS
  Filled 2020-05-12: qty 2

## 2020-05-12 MED ORDER — BENZONATATE 100 MG PO CAPS: 100.0000 mg | ORAL_CAPSULE | Freq: Three times a day (TID) | ORAL | 0 refills | Status: DC

## 2020-05-12 MED ORDER — ASPIRIN 81 MG PO CHEW
324.0000 mg | CHEWABLE_TABLET | Freq: Once | ORAL | Status: AC
  Administered 2020-05-12: 324 mg via ORAL
  Filled 2020-05-12: qty 4

## 2020-05-12 MED ORDER — KETOROLAC TROMETHAMINE 30 MG/ML IJ SOLN
30.0000 mg | Freq: Once | INTRAMUSCULAR | Status: AC
  Administered 2020-05-12: 30 mg via INTRAVENOUS
  Filled 2020-05-12: qty 1

## 2020-05-12 MED ORDER — NITROGLYCERIN 0.4 MG SL SUBL
0.4000 mg | SUBLINGUAL_TABLET | SUBLINGUAL | Status: DC | PRN
  Administered 2020-05-12 (×3): 0.4 mg via SUBLINGUAL
  Filled 2020-05-12: qty 1

## 2020-05-12 MED ORDER — PREDNISONE 50 MG PO TABS: 50.0000 mg | ORAL_TABLET | Freq: Every day | ORAL | 0 refills | Status: DC

## 2020-05-12 MED ORDER — NAPROXEN 375 MG PO TABS: 375.0000 mg | ORAL_TABLET | Freq: Two times a day (BID) | ORAL | 0 refills | Status: AC

## 2020-05-12 NOTE — ED Notes (Signed)
ED Provider at bedside. 

## 2020-05-12 NOTE — ED Provider Notes (Signed)
[facility] EMERGENCY DEPARTMENT Provider Note   CSN: [id number] Arrival date & time: 05/12/20  1126     History Chief Complaint  Patient presents with  . Shortness of Breath    Geoffrey Blankenship is a 55 y.o. male.  HPI   Patient presented to the ED for evaluation of shortness of breath and chest pain.  Patient states symptoms suddenly started at 1029 this morning.  He looked at his watch so he knew the exact time.  Patient suddenly started coughing.  He tried to use his rescue inhaler but the coughing persisted and he felt short of breath and felt like there was something sitting on his chest.  Those symptoms persisted.  He was at work and they ended up calling 911.  Patient was given albuterol by EMS.  He does feel like his breathing is somewhat better now but he still has that pressure sensation in his chest.  He does smoke but does not have any history of known heart disease.  He does have COPD and asthma.  Past Medical History:  Diagnosis Date  . Asthma   . COPD (chronic obstructive pulmonary disease) (HCC)   . Tobacco abuse     Patient Active Problem List   Diagnosis Date Noted  . Tobacco abuse     Past Surgical History:  Procedure Laterality Date  . ganglion cyst removal         Family History  Problem Relation Age of Onset  . Hypertension Mother   . Hyperlipidemia Mother   . COPD Mother   . Alcohol abuse Mother   . Diabetes Mother   . Alcohol abuse Father   . COPD Father   . Diabetes Father   . Hypertension Father   . Hyperlipidemia Father     Social History   Tobacco Use  . Smoking status: Current Every Day Smoker    Packs/day: 0.50  . Smokeless tobacco: Never Used  Substance Use Topics  . Alcohol use: Yes    Comment: very light  . Drug use: Not Currently    Home Medications Prior to Admission medications   Medication Sig Start Date End Date Taking? Authorizing Provider  benzonatate (TESSALON) 100 MG capsule Take 1 capsule (100 mg  total) by mouth every 8 (eight) hours. 05/12/20  Yes Linwood Dibbles, MD  naproxen (NAPROSYN) 375 MG tablet Take 1 tablet (375 mg total) by mouth 2 (two) times daily. 05/12/20  Yes Linwood Dibbles, MD  predniSONE (DELTASONE) 50 MG tablet Take 1 tablet (50 mg total) by mouth daily. 05/12/20  Yes Linwood Dibbles, MD  albuterol (VENTOLIN HFA) 108 (90 Base) MCG/ACT inhaler Inhale 2 puffs into the lungs every 6 (six) hours as needed for wheezing or shortness of breath. 07/11/19   Philip Aspen, Limmie Patricia, MD  budesonide-formoterol [facility]) 160-4.5 MCG/ACT inhaler Inhale 2 puffs into the lungs 2 (two) times daily. 07/11/19   Philip Aspen, Limmie Patricia, MD  buPROPion (WELLBUTRIN XL) 150 MG 24 hr tablet TAKE 1 TABLET BY MOUTH EVERY DAY 01/13/20   Philip Aspen, Limmie Patricia, MD  Tiotropium Bromide Monohydrate (SPIRIVA RESPIMAT) 2.5 MCG/ACT AERS Inhale 2 puffs into the lungs daily. 07/11/19   Philip Aspen, Limmie Patricia, MD    Allergies    Morphine and related and Morphine and related  Review of Systems   Review of Systems  All other systems reviewed and are negative.   Physical Exam Updated Vital Signs BP 117/71   Pulse 71   Temp  98.3 F (36.8 C) (Oral)   Resp 20   Ht 1.803 m (5\' 11" )   Wt 83.9 kg   SpO2 95%   BMI 25.80 kg/m   Physical Exam Vitals and nursing note reviewed.  Constitutional:      General: He is not in acute distress.    Appearance: He is well-developed.  HENT:     Head: Normocephalic and atraumatic.     Right Ear: External ear normal.     Left Ear: External ear normal.  Eyes:     General: No scleral icterus.       Right eye: No discharge.        Left eye: No discharge.     Conjunctiva/sclera: Conjunctivae normal.  Neck:     Trachea: No tracheal deviation.  Cardiovascular:     Rate and Rhythm: Normal rate and regular rhythm.  Pulmonary:     Effort: Pulmonary effort is normal. No respiratory distress.     Breath sounds: Normal breath sounds. No stridor. No wheezing or rales.   Abdominal:     General: Bowel sounds are normal. There is no distension.     Palpations: Abdomen is soft.     Tenderness: There is no abdominal tenderness. There is no guarding or rebound.  Musculoskeletal:        General: No tenderness.     Cervical back: Neck supple.  Skin:    General: Skin is warm and dry.     Findings: No rash.  Neurological:     Mental Status: He is alert.     Cranial Nerves: No cranial nerve deficit (no facial droop, extraocular movements intact, no slurred speech).     Sensory: No sensory deficit.     Motor: No abnormal muscle tone or seizure activity.     Coordination: Coordination normal.     ED Results / Procedures / Treatments   Labs (all labs ordered are listed, but only abnormal results are displayed) Labs Reviewed  BASIC METABOLIC PANEL - Abnormal; Notable for the following components:      Result Value   CO2 21 (*)    Glucose, Bld 138 (*)    Calcium 8.6 (*)    All other components within normal limits  CBC - Abnormal; Notable for the following components:   WBC 12.6 (*)    All other components within normal limits  CBG MONITORING, ED - Abnormal; Notable for the following components:   Glucose-Capillary 121 (*)    All other components within normal limits  D-DIMER, QUANTITATIVE  TROPONIN I (HIGH SENSITIVITY)  TROPONIN I (HIGH SENSITIVITY)    EKG EKG Interpretation  Date/Time:  Wednesday May 12 2020 11:26:53 EDT Ventricular Rate:  96 PR Interval:  156 QRS Duration: 85 QT Interval:  350 QTC Calculation: 443 R Axis:   24 Text Interpretation: Sinus rhythm Probable left atrial enlargement Borderline T wave abnormalities No old tracing to compare Confirmed by [id number] [phone]) on 05/12/2020 11:39:45 AM   Radiology DG Chest Portable 1 View  Result Date: 05/12/2020 CLINICAL DATA:  Chest pain.  Shortness of breath.  Cough. EXAM: PORTABLE CHEST 1 VIEW COMPARISON:  No prior. FINDINGS: Mediastinum and hilar structures normal. Heart size  normal. Mild bibasilar interstitial prominence. These changes may be chronic, an active interstitial process including mild interstitial edema and/or pneumonitis cannot be excluded. No pleural effusion or pneumothorax. IMPRESSION: Mild basilar interstitial prominence. These changes may be chronic, an active interstitial process including mild interstitial edema and or pneumonitis  cannot be excluded. Electronically Signed   By: Maisie Fus  Register   On: 05/12/2020 12:28    Procedures Procedures   Medications Ordered in ED Medications  aspirin chewable tablet 324 mg (324 mg Oral Given 05/12/20 1158)  ketorolac (TORADOL) 30 MG/ML injection 30 mg (30 mg Intravenous Given 05/12/20 1327)  methylPREDNISolone sodium succinate (SOLU-MEDROL) 125 mg/2 mL injection 125 mg (125 mg Intravenous Given 05/12/20 1327)    ED Course  I have reviewed the triage vital signs and the nursing notes.  Pertinent labs & imaging results that were available during my care of the patient were reviewed by me and considered in my medical decision making (see chart for details).  Clinical Course as of 05/13/20 1534  Wed May 12, 2020  1247 D-dimer negative.  Initial troponin normal.  Laboratory tests otherwise unremarkable. [JK]  1513 Serial troponins negative [JK]    Clinical Course User Index [JK] Linwood Dibbles, MD   MDM Rules/Calculators/A&P                          Pt presented with complaints of chest pain and shortness of breath.  Hx of copd.  Still having chest pain in the ED.  Workup reassuring.  D dimer negative, doubt pt.  No pna.  Serial troponins negative.  Doubt acs.  Breathing improved with treatment in the ED.  Suspect related to copd exacerbation.    Dc home with steroid.  Close Outpt follow up. Final Clinical Impression(s) / ED Diagnoses Final diagnoses:  COPD exacerbation (HCC)    Rx / DC Orders ED Discharge Orders         Ordered    predniSONE (DELTASONE) 50 MG tablet  Daily        05/12/20 1518     naproxen (NAPROSYN) 375 MG tablet  2 times daily        05/12/20 1518    benzonatate (TESSALON) 100 MG capsule  Every 8 hours        05/12/20 1518           Linwood Dibbles, MD 05/13/20 1536

## 2020-05-12 NOTE — Discharge Instructions (Addendum)
Take the medications as prescribed.  Continue your inhalers at home.Follow-up with your doctor in the next week or so to be rechecked.  Return as needed for worsening symptoms

## 2020-05-12 NOTE — ED Notes (Signed)
SpO2 88% on r/a. Denies increased SHOB, denies oxygen or CPAP at home.  SpO2 91%

## 2020-05-12 NOTE — ED Triage Notes (Signed)
Sudden onset sob and cough , used rescue inhaler w/ no improvement  Given 5 albuteral o2 sat 95  122/80 . ekg ok

## 2020-05-13 ENCOUNTER — Encounter: Payer: Self-pay | Admitting: Internal Medicine
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
## 2021-11-02 ENCOUNTER — Emergency Department (HOSPITAL_COMMUNITY)
Admission: EM | Admit: 2021-11-02 | Discharge: 2021-11-02 | Discharge status: Against Medical Advice | Payer: Managed Care, Other (non HMO) | Attending: Emergency Medicine | Admitting: Emergency Medicine

## 2021-11-02 ENCOUNTER — Emergency Department (HOSPITAL_COMMUNITY): Payer: Managed Care, Other (non HMO)

## 2021-11-02 ENCOUNTER — Other Ambulatory Visit: Payer: Self-pay

## 2021-11-02 DIAGNOSIS — R0602 Shortness of breath: Secondary | ICD-10-CM | POA: Insufficient documentation

## 2021-11-02 DIAGNOSIS — R5383 Other fatigue: Secondary | ICD-10-CM | POA: Diagnosis not present

## 2021-11-02 DIAGNOSIS — R0981 Nasal congestion: Secondary | ICD-10-CM | POA: Insufficient documentation

## 2021-11-02 DIAGNOSIS — R072 Precordial pain: Secondary | ICD-10-CM | POA: Insufficient documentation

## 2021-11-02 DIAGNOSIS — Z5321 Procedure and treatment not carried out due to patient leaving prior to being seen by health care provider: Secondary | ICD-10-CM | POA: Insufficient documentation

## 2021-11-02 DIAGNOSIS — R519 Headache, unspecified: Secondary | ICD-10-CM | POA: Diagnosis not present

## 2021-11-02 DIAGNOSIS — J449 Chronic obstructive pulmonary disease, unspecified: Secondary | ICD-10-CM | POA: Insufficient documentation

## 2021-11-02 LAB — CBC
HCT: 51.8 % (ref 39.0–52.0)
Hemoglobin: 17.1 g/dL — ABNORMAL HIGH (ref 13.0–17.0)
MCH: 30.3 pg (ref 26.0–34.0)
MCHC: 33 g/dL (ref 30.0–36.0)
MCV: 91.7 fL (ref 80.0–100.0)
Platelets: 208 10*3/uL (ref 150–400)
RBC: 5.65 MIL/uL (ref 4.22–5.81)
RDW: 13.4 % (ref 11.5–15.5)
WBC: 12.6 10*3/uL — ABNORMAL HIGH (ref 4.0–10.5)
nRBC: 0 % (ref 0.0–0.2)

## 2021-11-02 LAB — BASIC METABOLIC PANEL
Anion gap: 6 (ref 5–15)
BUN: 9 mg/dL (ref 6–20)
CO2: 26 mmol/L (ref 22–32)
Calcium: 9.3 mg/dL (ref 8.9–10.3)
Chloride: 109 mmol/L (ref 98–111)
Creatinine, Ser: 0.85 mg/dL (ref 0.61–1.24)
GFR, Estimated: >60 mL/min (ref >60)
Glucose, Bld: 109 mg/dL — ABNORMAL HIGH (ref 70–99)
Potassium: 3.9 mmol/L (ref 3.5–5.1)
Sodium: 141 mmol/L (ref 135–145)

## 2021-11-02 LAB — TROPONIN I (HIGH SENSITIVITY): Troponin I (High Sensitivity): 3 ng/L (ref <18)

## 2021-11-02 NOTE — ED Triage Notes (Signed)
Pt. Stated, Ive had chest tightness, SOB , congestion with a headache this started yesterday

## 2021-11-02 NOTE — ED Notes (Signed)
Pt stated that the 2 hour wait was to longer and the pt was very rude. Pt stated he will just follow up with his primary care doctor. Pt and his wife left the hospital.

## 2021-11-02 NOTE — ED Provider Triage Note (Signed)
Emergency Medicine Provider Triage Evaluation Note  Geoffrey Blankenship , a 56 y.o. male  was evaluated in triage.  Pt complains of 4 days of increasing shortness of breath.  Patient also complains today of substernal chest tightness rated 7 out of 10 in severity.  The patient endorses a COPD history and states that he has been taking his rescue inhaler over the weekend which has provided roughly 5 minutes of relief at a time.  He would have some chest tightness that would self resolve within 30 minutes.  Starting yesterday this tightness seem to linger.  He scheduled an appointment primary care this morning who recommended he come to the emergency department due to chest pressure.  Patient denies any radiation of symptoms at this time, abdominal pain, nausea, vomiting.  Endorses shortness of breath and chest tightness.  Review of Systems  Positive: As above Negative: As above  Physical Exam  BP 118/85 (BP Location: Left Arm)   Pulse (!) 103   Temp 97.7 F (36.5 C) (Oral)   Resp 18   SpO2 95%  Gen:   Awake, no distress   Resp:  Normal effort  MSK:   Moves extremities without difficulty  Other:    Medical Decision Making  Medically screening exam initiated at 9:49 AM.  Appropriate orders placed.  Geoffrey Blankenship was informed that the remainder of the evaluation will be completed by another provider, this initial triage assessment does not replace that evaluation, and the importance of remaining in the ED until their evaluation is complete.     Dorothyann Peng, [place] 11/02/21 [phone]

## 2021-11-03 ENCOUNTER — Encounter: Payer: Self-pay | Admitting: Emergency Medicine

## 2021-11-03 ENCOUNTER — Ambulatory Visit
Admission: EM | Admit: 2021-11-03 | Discharge: 2021-11-03 | Disposition: A | Discharge status: Home / Self Care | Payer: Managed Care, Other (non HMO) | Attending: Internal Medicine | Admitting: Internal Medicine

## 2021-11-03 DIAGNOSIS — F17210 Nicotine dependence, cigarettes, uncomplicated: Secondary | ICD-10-CM | POA: Diagnosis not present

## 2021-11-03 DIAGNOSIS — Z836 Family history of other diseases of the respiratory system: Secondary | ICD-10-CM | POA: Diagnosis not present

## 2021-11-03 DIAGNOSIS — Z1152 Encounter for screening for COVID-19: Secondary | ICD-10-CM | POA: Diagnosis not present

## 2021-11-03 DIAGNOSIS — J441 Chronic obstructive pulmonary disease with (acute) exacerbation: Secondary | ICD-10-CM | POA: Diagnosis not present

## 2021-11-03 DIAGNOSIS — J069 Acute upper respiratory infection, unspecified: Secondary | ICD-10-CM

## 2021-11-03 DIAGNOSIS — R059 Cough, unspecified: Secondary | ICD-10-CM | POA: Diagnosis present

## 2021-11-03 MED ORDER — IPRATROPIUM-ALBUTEROL 0.5-2.5 (3) MG/3ML IN SOLN
3.0000 mL | Freq: Once | RESPIRATORY_TRACT | Status: AC
  Administered 2021-11-03: 3 mL via RESPIRATORY_TRACT

## 2021-11-03 MED ORDER — BENZONATATE 100 MG PO CAPS: 100.0000 mg | ORAL_CAPSULE | Freq: Three times a day (TID) | ORAL | 0 refills | Status: DC | PRN

## 2021-11-03 MED ORDER — PREDNISONE 10 MG (21) PO TBPK: ORAL_TABLET | Freq: Every day | ORAL | 0 refills | Status: DC

## 2021-11-03 NOTE — ED Provider Notes (Signed)
[name]    CSN: [id number] Arrival date & time: 11/03/21  1628      History   Chief Complaint Chief Complaint  Patient presents with   Cough   Nasal Congestion   Shortness of Breath   Headache    HPI Geoffrey Blankenship is a 56 y.o. male.   Patient presents with nasal congestion, headache, cough, shortness of breath, feelings of chest tightness, feelings of chest congestion that started about 4 days ago.  Denies any known sick contacts or fever.  Denies chest pain, sore throat, ear pain, nausea, vomiting, diarrhea, abdominal pain.  Patient went to the ED yesterday but left before being discharged.  Patient had unremarkable EKG, chest x-ray, blood work.  Patient does have history of COPD and has been using albuterol inhaler at home with no improvement.   Cough Shortness of Breath Headache   Past Medical History:  Diagnosis Date   Asthma    COPD (chronic obstructive pulmonary disease) (HCC)    Tobacco abuse     Patient Active Problem List   Diagnosis Date Noted   Tobacco abuse     Past Surgical History:  Procedure Laterality Date   ganglion cyst removal         Home Medications    Prior to Admission medications   Medication Sig Start Date End Date Taking? Authorizing Provider  benzonatate (TESSALON) 100 MG capsule Take 1 capsule (100 mg total) by mouth every 8 (eight) hours as needed for cough. 11/03/21  Yes [place] Behlke, Rolly Salter E, FNP  predniSONE (STERAPRED UNI-PAK 21 TAB) 10 MG (21) TBPK tablet Take by mouth daily. Take 6 tabs by mouth daily  for 2 days, then 5 tabs for 2 days, then 4 tabs for 2 days, then 3 tabs for 2 days, 2 tabs for 2 days, then 1 tab by mouth daily for 2 days 11/03/21  Yes Pamelyn Bancroft, Rolly Salter E, FNP  albuterol (VENTOLIN HFA) 108 (90 Base) MCG/ACT inhaler Inhale 2 puffs into the lungs every 6 (six) hours as needed for wheezing or shortness of breath. 07/11/19   Philip Aspen, Limmie Patricia, MD  budesonide-formoterol [facility]) 160-4.5 MCG/ACT  inhaler Inhale 2 puffs into the lungs 2 (two) times daily. 07/11/19   Philip Aspen, Limmie Patricia, MD  buPROPion (WELLBUTRIN XL) 150 MG 24 hr tablet TAKE 1 TABLET BY MOUTH EVERY DAY 01/13/20   Philip Aspen, Limmie Patricia, MD  naproxen (NAPROSYN) 375 MG tablet Take 1 tablet (375 mg total) by mouth 2 (two) times daily. 05/12/20   Linwood Dibbles, MD  Tiotropium Bromide Monohydrate (SPIRIVA RESPIMAT) 2.5 MCG/ACT AERS Inhale 2 puffs into the lungs daily. 07/11/19   Philip Aspen, Limmie Patricia, MD    Family History Family History  Problem Relation Age of Onset   Hypertension Mother    Hyperlipidemia Mother    COPD Mother    Alcohol abuse Mother    Diabetes Mother    Alcohol abuse Father    COPD Father    Diabetes Father    Hypertension Father    Hyperlipidemia Father     Social History Social History   Tobacco Use   Smoking status: Every Day    Packs/day: 0.50    Types: Cigarettes   Smokeless tobacco: Never  Substance Use Topics   Alcohol use: Yes    Comment: very light   Drug use: Not Currently     Allergies   Morphine and related and Morphine and related   Review of Systems Review  of Systems Per HPI  Physical Exam Triage Vital Signs ED Triage Vitals [11/03/21 1655]  Enc Vitals Group     BP 136/76     Pulse Rate 86     Resp 17     Temp 97.8 F (36.6 C)     Temp src      SpO2 94 %     Weight      Height      Head Circumference      Peak Flow      Pain Score 0     Pain Loc      Pain Edu?      Excl. in [place]?    No data found.  Updated Vital Signs BP 136/76   Pulse 86   Temp 97.8 F (36.6 C)   Resp 17   SpO2 94%   Visual Acuity Right Eye Distance:   Left Eye Distance:   Bilateral Distance:    Right Eye Near:   Left Eye Near:    Bilateral Near:     Physical Exam Constitutional:      General: He is not in acute distress.    Appearance: Normal appearance. He is not toxic-appearing or diaphoretic.  HENT:     Head: Normocephalic and atraumatic.     Right  Ear: Tympanic membrane and ear canal normal.     Left Ear: Tympanic membrane and ear canal normal.     Nose: Congestion present.     Mouth/Throat:     Mouth: Mucous membranes are moist.     Pharynx: No posterior oropharyngeal erythema.  Eyes:     Extraocular Movements: Extraocular movements intact.     Conjunctiva/sclera: Conjunctivae normal.     Pupils: Pupils are equal, round, and reactive to light.  Cardiovascular:     Rate and Rhythm: Normal rate and regular rhythm.     Pulses: Normal pulses.     Heart sounds: Normal heart sounds.  Pulmonary:     Effort: Pulmonary effort is normal. No respiratory distress.     Breath sounds: Normal breath sounds. No stridor. No wheezing, rhonchi or rales.  Abdominal:     General: Abdomen is flat. Bowel sounds are normal.     Palpations: Abdomen is soft.  Musculoskeletal:        General: Normal range of motion.     Cervical back: Normal range of motion.  Skin:    General: Skin is warm and dry.  Neurological:     General: No focal deficit present.     Mental Status: He is alert and oriented to person, place, and time. Mental status is at baseline.  Psychiatric:        Mood and Affect: Mood normal.        Behavior: Behavior normal.      UC Treatments / Results  Labs (all labs ordered are listed, but only abnormal results are displayed) Labs Reviewed  SARS CORONAVIRUS 2 (TAT 6-24 HRS)    EKG   Radiology DG Chest 2 View  Result Date: 11/02/2021 CLINICAL DATA:  Chest pain/shortness of breath. EXAM: CHEST - 2 VIEW COMPARISON:  Chest x-ray May 12, 2020. FINDINGS: The heart size and mediastinal contours are within normal limits. Both lungs are clear. No visible pleural effusions or pneumothorax. No acute osseous abnormality. IMPRESSION: No active cardiopulmonary disease. Electronically Signed   By: Margaretha Sheffield M.D.   On: 11/02/2021 10:19    Procedures Procedures (including critical care time)  Medications Ordered in  UC Medications  ipratropium-albuterol (DUONEB) 0.5-2.5 (3) MG/3ML nebulizer solution 3 mL (3 mLs Nebulization Given 11/03/21 1722)    Initial Impression / Assessment and Plan / UC Course  I have reviewed the triage vital signs and the nursing notes.  Pertinent labs & imaging results that were available during my care of the patient were reviewed by me and considered in my medical decision making (see chart for details).     Patient's chest x-ray at ED visit yesterday was unremarkable.  EKG was unremarkable.  Suspect that patient has a viral illness that is causing a flareup of patient's COPD.  DuoNeb administered in urgent care today with patient stating that he felt better.  I do think patient would benefit from prednisone to decrease inflammation in chest and to help with shortness of breath.  Patient has taken prednisone before and tolerated well.  No obvious contraindications to prednisone noted in patient's history.  Also advised patient of supportive care and symptom management.  COVID test is pending.  Patient's vital signs are stable, no signs of respiratory distress, no tachypnea, no adventitious lung sounds so do not think that any emergent evaluation is necessary.  Patient was given strict return and ER precautions.  Patient verbalized understanding and was agreeable with plan. Final Clinical Impressions(s) / UC Diagnoses   Final diagnoses:  Viral upper respiratory tract infection with cough  COPD exacerbation [facility])     Discharge Instructions      It appears that you have a viral illness that is causing a flareup of your COPD.  I have prescribed you prednisone and a cough medication to help alleviate symptoms.  Please follow-up if symptoms persist or worsen.  COVID test is pending.  We will call if it is positive.    ED Prescriptions     Medication Sig Dispense Auth. Provider   predniSONE (STERAPRED UNI-PAK 21 TAB) 10 MG (21) TBPK tablet Take by mouth daily. Take 6 tabs by  mouth daily  for 2 days, then 5 tabs for 2 days, then 4 tabs for 2 days, then 3 tabs for 2 days, 2 tabs for 2 days, then 1 tab by mouth daily for 2 days 42 tablet [place], [place], [place]   benzonatate (TESSALON) 100 MG capsule Take 1 capsule (100 mg total) by mouth every 8 (eight) hours as needed for cough. 21 capsule [place], Acie Fredrickson, [place]      PDMP not reviewed this encounter.   Gustavus Bryant, [place] 11/03/21 [phone]

## 2021-11-03 NOTE — Discharge Instructions (Signed)
It appears that you have a viral illness that is causing a flareup of your COPD.  I have prescribed you prednisone and a cough medication to help alleviate symptoms.  Please follow-up if symptoms persist or worsen.  COVID test is pending.  We will call if it is positive.

## 2021-11-03 NOTE — ED Triage Notes (Signed)
Pt is present today with c/o HA, cough, nasal congestion and tightness, SOB, and chest congestion. Pt sx started x4 days ago

## 2021-11-05 LAB — SARS CORONAVIRUS 2 (TAT 6-24 HRS): SARS Coronavirus 2: NEGATIVE

## 2022-11-24 ENCOUNTER — Telehealth: Payer: Managed Care, Other (non HMO) | Admitting: Physician Assistant

## 2022-11-24 DIAGNOSIS — B9689 Other specified bacterial agents as the cause of diseases classified elsewhere: Secondary | ICD-10-CM

## 2022-11-24 DIAGNOSIS — J208 Acute bronchitis due to other specified organisms: Secondary | ICD-10-CM | POA: Diagnosis not present

## 2022-11-24 MED ORDER — DOXYCYCLINE HYCLATE 100 MG PO TABS: 100.0000 mg | ORAL_TABLET | Freq: Two times a day (BID) | ORAL | 0 refills | Status: AC

## 2022-11-24 MED ORDER — PREDNISONE 20 MG PO TABS: 40.0000 mg | ORAL_TABLET | Freq: Every day | ORAL | 0 refills | Status: AC

## 2022-11-24 MED ORDER — BENZONATATE 100 MG PO CAPS: 100.0000 mg | ORAL_CAPSULE | Freq: Three times a day (TID) | ORAL | 0 refills | Status: AC | PRN

## 2022-11-24 NOTE — Progress Notes (Signed)
